# Patient Record
Sex: Male | Born: 1975 | Race: White | Marital: Married | Smoking: Never smoker
Health system: Southern US, Community
[De-identification: ages and names within clinical notes are randomized; demographics above are authoritative.]

## PROBLEM LIST (undated history)

## (undated) DIAGNOSIS — K219 Gastro-esophageal reflux disease without esophagitis: Secondary | ICD-10-CM

## (undated) DIAGNOSIS — J309 Allergic rhinitis, unspecified: Secondary | ICD-10-CM

## (undated) HISTORY — DX: Gastro-esophageal reflux disease without esophagitis: K21.9

## (undated) HISTORY — PX: ADENOIDECTOMY AND MYRINGOTOMY WITH TUBE PLACEMENT: SHX5714

## (undated) HISTORY — DX: Allergic rhinitis, unspecified: J30.9

---

## 2019-02-14 ENCOUNTER — Other Ambulatory Visit: Payer: Self-pay

## 2019-02-14 ENCOUNTER — Encounter: Payer: Self-pay | Admitting: Family Medicine

## 2019-02-14 ENCOUNTER — Ambulatory Visit: Payer: Self-pay | Attending: Family Medicine | Admitting: Family Medicine

## 2019-02-14 VITALS — BP 121/72 | HR 93 | Temp 98.6°F | Resp 18 | Ht 74.0 in | Wt 191.0 lb

## 2019-02-14 DIAGNOSIS — E876 Hypokalemia: Secondary | ICD-10-CM

## 2019-02-14 DIAGNOSIS — K760 Fatty (change of) liver, not elsewhere classified: Secondary | ICD-10-CM

## 2019-02-14 DIAGNOSIS — R1013 Epigastric pain: Secondary | ICD-10-CM

## 2019-02-14 MED ORDER — OMEPRAZOLE 40 MG PO CPDR: 40.0000 mg | DELAYED_RELEASE_CAPSULE | Freq: Two times a day (BID) | ORAL | 1 refills | Status: AC

## 2019-02-14 NOTE — Patient Instructions (Signed)
Fatty Liver Disease  Fatty liver disease occurs when too much fat has built up in your liver cells. Fatty liver disease is also called hepatic steatosis or steatohepatitis. The liver removes harmful substances from your bloodstream and produces fluids that your body needs. It also helps your body use and store energy from the food you eat. In many cases, fatty liver disease does not cause symptoms or problems. It is often diagnosed when tests are being done for other reasons. However, over time, fatty liver can cause inflammation that may lead to more serious liver problems, such as scarring of the liver (cirrhosis) and liver failure. Fatty liver is associated with insulin resistance, increased body fat, high blood pressure (hypertension), and high cholesterol. These are features of metabolic syndrome and increase your risk for stroke, diabetes, and heart disease. What are the causes? This condition may be caused by:  Drinking too much alcohol.  Poor nutrition.  Obesity.  Cushing's syndrome.  Diabetes.  High cholesterol.  Certain drugs.  Poisons.  Some viral infections.  Pregnancy. What increases the risk? You are more likely to develop this condition if you:  Abuse alcohol.  Are overweight.  Have diabetes.  Have hepatitis.  Have a high triglyceride level.  Are pregnant. What are the signs or symptoms? Fatty liver disease often does not cause symptoms. If symptoms do develop, they can include:  Fatigue.  Weakness.  Weight loss.  Confusion.  Abdominal pain.  Nausea and vomiting.  Yellowing of your skin and the white parts of your eyes (jaundice).  Itchy skin. How is this diagnosed? This condition may be diagnosed by:  A physical exam and medical history.  Blood tests.  Imaging tests, such as an ultrasound, CT scan, or MRI.  A liver biopsy. A small sample of liver tissue is removed using a needle. The sample is then looked at under a microscope. How  is this treated? Fatty liver disease is often caused by other health conditions. Treatment for fatty liver may involve medicines and lifestyle changes to manage conditions such as:  Alcoholism.  High cholesterol.  Diabetes.  Being overweight or obese. Follow these instructions at home:   Do not drink alcohol. If you have trouble quitting, ask your health care provider how to safely quit with the help of medicine or a supervised program. This is important to keep your condition from getting worse.  Eat a healthy diet as told by your health care provider. Ask your health care provider about working with a diet and nutrition specialist (dietitian) to develop an eating plan.  Exercise regularly. This can help you lose weight and control your cholesterol and diabetes. Talk to your health care provider about an exercise plan and which activities are best for you.  Take over-the-counter and prescription medicines only as told by your health care provider.  Keep all follow-up visits as told by your health care provider. This is important. Contact a health care provider if: You have trouble controlling your:  Blood sugar. This is especially important if you have diabetes.  Cholesterol.  Drinking of alcohol. Get help right away if:  You have abdominal pain.  You have jaundice.  You have nausea and vomiting.  You vomit blood or material that looks like coffee grounds.  You have stools that are black, tar-like, or bloody. Summary  Fatty liver disease develops when too much fat builds up in the cells of your liver.  Fatty liver disease often causes no symptoms or problems. However, over   time, fatty liver can cause inflammation that may lead to more serious liver problems, such as scarring of the liver (cirrhosis).  You are more likely to develop this condition if you abuse alcohol, are pregnant, are overweight, have diabetes, have hepatitis, or have high triglyceride levels.   Contact your health care provider if you have trouble controlling your weight, blood sugar, cholesterol, or drinking of alcohol. This information is not intended to replace advice given to you by your health care provider. Make sure you discuss any questions you have with your health care provider. Document Released: 01/02/2006 Document Revised: 08/26/2017 Document Reviewed: 08/26/2017 Elsevier Interactive Patient Education  2019 [employer].  Food Choices for Gastroesophageal Reflux Disease, Adult When you have gastroesophageal reflux disease (GERD), the foods you eat and your eating habits are very important. Choosing the right foods can help ease your discomfort. Think about working with a nutrition specialist (dietitian) to help you make good choices. What are tips for following this plan?  Meals  Choose healthy foods that are low in fat, such as fruits, vegetables, whole grains, low-fat dairy products, and lean meat, fish, and poultry.  Eat small meals often instead of 3 large meals a day. Eat your meals slowly, and in a place where you are relaxed. Avoid bending over or lying down until 2-3 hours after eating.  Avoid eating meals 2-3 hours before bed.  Avoid drinking a lot of liquid with meals.  Cook foods using methods other than frying. Bake, grill, or broil food instead.  Avoid or limit: ? Chocolate. ? Peppermint or spearmint. ? Alcohol. ? Pepper. ? Black and decaffeinated coffee. ? Black and decaffeinated tea. ? Bubbly (carbonated) soft drinks. ? Caffeinated energy drinks and soft drinks.  Limit high-fat foods such as: ? Fatty meat or fried foods. ? Whole milk, cream, butter, or ice cream. ? Nuts and nut butters. ? Pastries, donuts, and sweets made with butter or shortening.  Avoid foods that cause symptoms. These foods may be different for everyone. Common foods that cause symptoms include: ? Tomatoes. ? Oranges, lemons, and limes. ? Peppers. ? Spicy food. ?  Onions and garlic. ? Vinegar. Lifestyle  Maintain a healthy weight. Ask your doctor what weight is healthy for you. If you need to lose weight, work with your doctor to do so safely.  Exercise for at least 30 minutes for 5 or more days each week, or as told by your doctor.  Wear loose-fitting clothes.  Do not smoke. If you need help quitting, ask your doctor.  Sleep with the head of your bed higher than your feet. Use a wedge under the mattress or blocks under the bed frame to raise the head of the bed. Summary  When you have gastroesophageal reflux disease (GERD), food and lifestyle choices are very important in easing your symptoms.  Eat small meals often instead of 3 large meals a day. Eat your meals slowly, and in a place where you are relaxed.  Limit high-fat foods such as fatty meat or fried foods.  Avoid bending over or lying down until 2-3 hours after eating.  Avoid peppermint and spearmint, caffeine, alcohol, and chocolate. This information is not intended to replace advice given to you by your health care provider. Make sure you discuss any questions you have with your health care provider. Document Released: 05/18/2012 Document Revised: 12/23/2016 Document Reviewed: 12/23/2016 Elsevier Interactive Patient Education  2019 [employer].

## 2019-02-14 NOTE — Progress Notes (Signed)
New Patient Office Visit  Subjective:  Patient ID: Henry Simasicholas Robertson, male    DOB: Aug 24, 1976  Age: 43 y.o. MRN: [id number]  CC: New patient with complaint of mid abdominal pain, recurrent  HPI Henry Robertson presents to establish care.  Patient reports that he has had recurrent episodes since last summer of mid upper abdominal discomfort.  Patient states that last year he was seen in the emergency department after having acute onset of pain in his mid chest and mid upper abdomen.  Patient states that he was diagnosed with irritation of his esophagus and he was given medication to reduce stomach acid which he did not take.  Patient states that in the past he took PPIs for 10 to 12 years and then had difficulty weaning himself off of the medication.  Patient states that he had heard bad things regarding the medication and he did not wish to take the medication.  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  Patient does have burping/belching and backwash of fluid into the throat and mouth at times.  Patient also states that he had an ultrasound done of his liver and gallbladder as well as a HIDA scan and he was told that his gallbladder was normal.  Patient is aware that he also was told that he has a fatty liver.  He has occasional right upper quadrant discomfort but most of his discomfort is in his mid upper abdomen.  Patient denies any diarrhea or constipation.  Patient with some occasional nausea.  Patient denies any frequent alcohol use and in fact states that his alcohol use is rare.  Patient does not smoke.  He denies any significant past medical history other than prior reflux for which he took PPIs which he thinks may have been omeprazole.  He also has to take medication for allergic rhinitis seasonally.  Patient with past history of insertion of ear tubes and removal of his adenoids which occurred when  he was somewhere between 89 to 43 years old.  Patient reports no significant family history and family history is negative for cancers, heart disease, hypertension, stroke and diabetes. Past Medical History:  Diagnosis Date  . Allergic rhinitis   . GERD (gastroesophageal reflux disease)    patient reports using PPI therapy for 12 years then quit due to concerns about the medication   Past Surgical History:  Procedure Laterality Date  . ADENOIDECTOMY AND MYRINGOTOMY WITH TUBE PLACEMENT     done at age 43 or 14-patient unsure   Social History   Tobacco Use  . Smoking status: Never Smoker  . Smokeless tobacco: Never Used  Substance Use Topics  . Alcohol use: Not Currently  . Drug use: Not Currently   Family History  Problem Relation Age of Onset  . Cancer Neg Hx   . CAD Neg Hx   . Diabetes Neg Hx   . Hypertension Neg Hx    Allergies  Allergen Reactions  . Dairy Aid [Lactase] Anaphylaxis  Patient reports throat swelling with consumption of dairy products such as ice cream    ROS Review of Systems  Constitutional: Negative for chills, fatigue and fever.  HENT: Positive for congestion. Negative for ear pain, hearing loss, nosebleeds, postnasal drip and rhinorrhea.   Eyes: Negative for photophobia and visual disturbance.  Respiratory: Negative for cough and shortness of breath.   Cardiovascular: Positive for chest  pain (occasional substernal chest pressure). Negative for palpitations and leg swelling.  Gastrointestinal: Positive for abdominal pain and nausea. Negative for blood in stool, constipation and diarrhea.  Endocrine: Negative for cold intolerance, heat intolerance, polydipsia, polyphagia and polyuria.  Genitourinary: Negative for dysuria and frequency.  Musculoskeletal: Negative for arthralgias, back pain, gait problem and joint swelling.  Neurological: Negative for dizziness and headaches.  Hematological: Negative for adenopathy. Does not bruise/bleed easily.     Objective:   Today's Vitals: BP 121/72 (BP Location: Left Arm, Patient Position: Sitting, Cuff Size: Normal)   Pulse 93   Temp 98.6 F (37 C) (Oral)   Resp 18   Ht 6\' 2"  (1.88 m)   Wt 191 lb (86.6 kg)   SpO2 100%   BMI 24.52 kg/m   Physical Exam Vitals signs and nursing note reviewed.  Constitutional:      General: He is not in acute distress.    Appearance: Normal appearance. He is not ill-appearing.     Comments: Patient is accompanied by his girlfriend at today's visit  HENT:     Head: Normocephalic and atraumatic.     Right Ear: Ear canal and external ear normal.     Left Ear: Ear canal and external ear normal.     Ears:     Comments: Right TM is light pink and left TM is dull and slightly retracted    Nose: Congestion and rhinorrhea (mild clear nasal drainage) present.     Mouth/Throat:     Mouth: Mucous membranes are moist.     Pharynx: Posterior oropharyngeal erythema (mild to moderate posterior pharnyx erythema) present.  Eyes:     General: No scleral icterus.    Extraocular Movements: Extraocular movements intact.     Conjunctiva/sclera: Conjunctivae normal.  Cardiovascular:     Rate and Rhythm: Normal rate and regular rhythm.  Pulmonary:     Effort: Pulmonary effort is normal.     Breath sounds: Normal breath sounds.  Abdominal:     General: Bowel sounds are normal.     Palpations: Abdomen is soft.     Tenderness: There is no abdominal tenderness. There is no right CVA tenderness, left CVA tenderness or guarding.  Musculoskeletal:        General: No tenderness.     Right lower leg: No edema.     Left lower leg: No edema.  Skin:    General: Skin is warm and dry.     Findings: No lesion or rash.  Neurological:     General: No focal deficit present.     Mental Status: He is alert and oriented to person, place, and time.  Psychiatric:        Mood and Affect: Mood normal.        Behavior: Behavior normal.        Thought Content: Thought content  normal.        Judgment: Judgment normal.     Comments: Slightly anxious     Assessment & Plan:  1. Epigastric pain Patient with complaint of epigastric pain.  Patient is reluctant to take PPI therapy secondary to hearing bad things about PPIs in the past and patient reports past use of PPIs for 10 to 12 years.  Patient will have H. pylori breath test done at today's visit to look for bacteria that may cause stomach ulcers.  Patient was prescribed Prilosec 40 mg twice daily in case of positive H. pylori breath test and also to see if  this would help improve his symptoms however patient states that he likely will not start the medication until he knows about the results of the H. pylori test.  Patient also has been referred to gastroenterology for further evaluation and treatment.  Patient has had prior ED visit for upper abdominal pain/atypical chest pain after alcohol consumption and patient with negative lipase at that time.  Patient will have CMP to look for any electrolyte or liver abnormality that may be contributing to his symptoms.  Patient has had prior right upper quadrant ultrasound with normal gallbladder and presence of fatty liver.  Patient also had hepatobiliary scan on 05/31/2018 with normal ejection fraction of 54%. - H. pylori breath test - omeprazole (PRILOSEC) 40 MG capsule; Take 1 capsule (40 mg total) by mouth 2 (two) times daily. To reduce stomach acid  Dispense: 60 capsule; Refill: 1 - Comprehensive metabolic panel - Ambulatory referral to Gastroenterology  2. Fatty liver Patient with prior ultrasound of the liver 05/26/2018 at [facility] [place] in [place] showing mild hepatomegaly with diffuse fatty infiltration of the liver.  Patient is advised to avoid alcohol, Tylenol as well as to decrease consumption of fatty foods.  Patient has been referred to GI for further evaluation.  Patient will have CMP in follow-up - Comprehensive metabolic panel  3.  Hypokalemia Patient has had prior hypokalemia with potassium level of 3.0 during emergency department visit on 05/20/2018 and [place] and patient will have repeat of electrolytes/potassium as part of his CMP - Comprehensive metabolic panel  Allergies as of 02/14/2019      Reactions   Dairy Aid [lactase] Anaphylaxis      Medication List       Accurate as of February 14, 2019 11:59 PM. Always use your most recent med list.        omeprazole 40 MG capsule Commonly known as:  PRILOSEC Take 1 capsule (40 mg total) by mouth 2 (two) times daily. To reduce stomach acid   ONE-A-DAY MENS PO Take 1 tablet by mouth daily.      An After Visit Summary was printed and given to the patient.   Follow-up: Return in about 6 weeks (around 03/28/2019) for epigastric pain-sooner if worsening of symptoms.  Cain Saupe, MD

## 2019-02-15 LAB — COMPREHENSIVE METABOLIC PANEL WITH GFR
ALT: 34 IU/L (ref 0–44)
AST: 32 IU/L (ref 0–40)
Albumin/Globulin Ratio: 1.8 (ref 1.2–2.2)
Albumin: 4.3 g/dL (ref 4.0–5.0)
Alkaline Phosphatase: 75 IU/L (ref 39–117)
BUN/Creatinine Ratio: 16 (ref 9–20)
BUN: 15 mg/dL (ref 6–24)
Bilirubin Total: 0.7 mg/dL (ref 0.0–1.2)
CO2: 25 mmol/L (ref 20–29)
Calcium: 8.9 mg/dL (ref 8.7–10.2)
Chloride: 102 mmol/L (ref 96–106)
Creatinine, Ser: 0.94 mg/dL (ref 0.76–1.27)
GFR calc Af Amer: 117 mL/min/1.73
GFR calc non Af Amer: 101 mL/min/1.73
Globulin, Total: 2.4 g/dL (ref 1.5–4.5)
Glucose: 90 mg/dL (ref 65–99)
Potassium: 4 mmol/L (ref 3.5–5.2)
Sodium: 143 mmol/L (ref 134–144)
Total Protein: 6.7 g/dL (ref 6.0–8.5)

## 2019-02-15 LAB — H. PYLORI BREATH TEST: H pylori Breath Test: NEGATIVE

## 2019-02-16 ENCOUNTER — Telehealth: Payer: Self-pay | Admitting: *Deleted

## 2019-02-16 NOTE — Telephone Encounter (Signed)
-----   Message from Cain Saupe, MD sent at 02/15/2019  4:58 PM EDT ----- Please notify patient of normal CMP and negative H. Pylori breath test

## 2019-02-16 NOTE — Telephone Encounter (Signed)
Medical Assistant left message on patient's home and cell voicemail. Voicemail states to give a call back to Faduma Cho with [facility] at [phone].  

## 2019-03-02 ENCOUNTER — Telehealth: Payer: Self-pay | Admitting: Family Medicine

## 2019-03-02 NOTE — Telephone Encounter (Signed)
Patient says he is willing to pay out of pocket for his GI referral and would prefer to be sent somewhere within [place]/ somewhere that offers self pay discount.

## 2019-03-02 NOTE — Telephone Encounter (Signed)
Sent Referral to [facility] Ph# [phone] #[phone] .They will contact the patient to schedule an appointment  ° °

## 2019-03-28 ENCOUNTER — Ambulatory Visit: Payer: Self-pay | Admitting: Family Medicine

## 2019-03-30 ENCOUNTER — Other Ambulatory Visit: Payer: Self-pay | Admitting: Gastroenterology

## 2019-03-30 DIAGNOSIS — R1010 Upper abdominal pain, unspecified: Secondary | ICD-10-CM

## 2019-03-30 DIAGNOSIS — K219 Gastro-esophageal reflux disease without esophagitis: Secondary | ICD-10-CM

## 2019-04-08 ENCOUNTER — Ambulatory Visit
Admission: RE | Admit: 2019-04-08 | Discharge: 2019-04-08 | Disposition: A | Discharge status: Home / Self Care | Payer: Self-pay | Source: Ambulatory Visit | Attending: Gastroenterology | Admitting: Gastroenterology

## 2019-04-08 DIAGNOSIS — K219 Gastro-esophageal reflux disease without esophagitis: Secondary | ICD-10-CM

## 2019-04-08 DIAGNOSIS — R1010 Upper abdominal pain, unspecified: Secondary | ICD-10-CM

## 2019-04-11 ENCOUNTER — Other Ambulatory Visit: Payer: Self-pay | Admitting: Gastroenterology

## 2019-04-11 DIAGNOSIS — R1010 Upper abdominal pain, unspecified: Secondary | ICD-10-CM

## 2019-04-18 ENCOUNTER — Ambulatory Visit
Admission: RE | Admit: 2019-04-18 | Discharge: 2019-04-18 | Disposition: A | Discharge status: Home / Self Care | Payer: No Typology Code available for payment source | Source: Ambulatory Visit | Attending: Gastroenterology | Admitting: Gastroenterology

## 2019-04-18 ENCOUNTER — Other Ambulatory Visit: Payer: No Typology Code available for payment source

## 2019-04-18 ENCOUNTER — Other Ambulatory Visit: Payer: Self-pay | Admitting: Gastroenterology

## 2019-04-18 DIAGNOSIS — R1010 Upper abdominal pain, unspecified: Secondary | ICD-10-CM

## 2021-01-01 IMAGING — CR ABDOMEN - 2 VIEW
2 series · 2 of 2 positions shown · non-contrast
Comparison: Upper GI series 04/08/2019

CLINICAL DATA: Epigastric pain

EXAM:
ABDOMEN - 2 VIEW

[t abdomen supine]
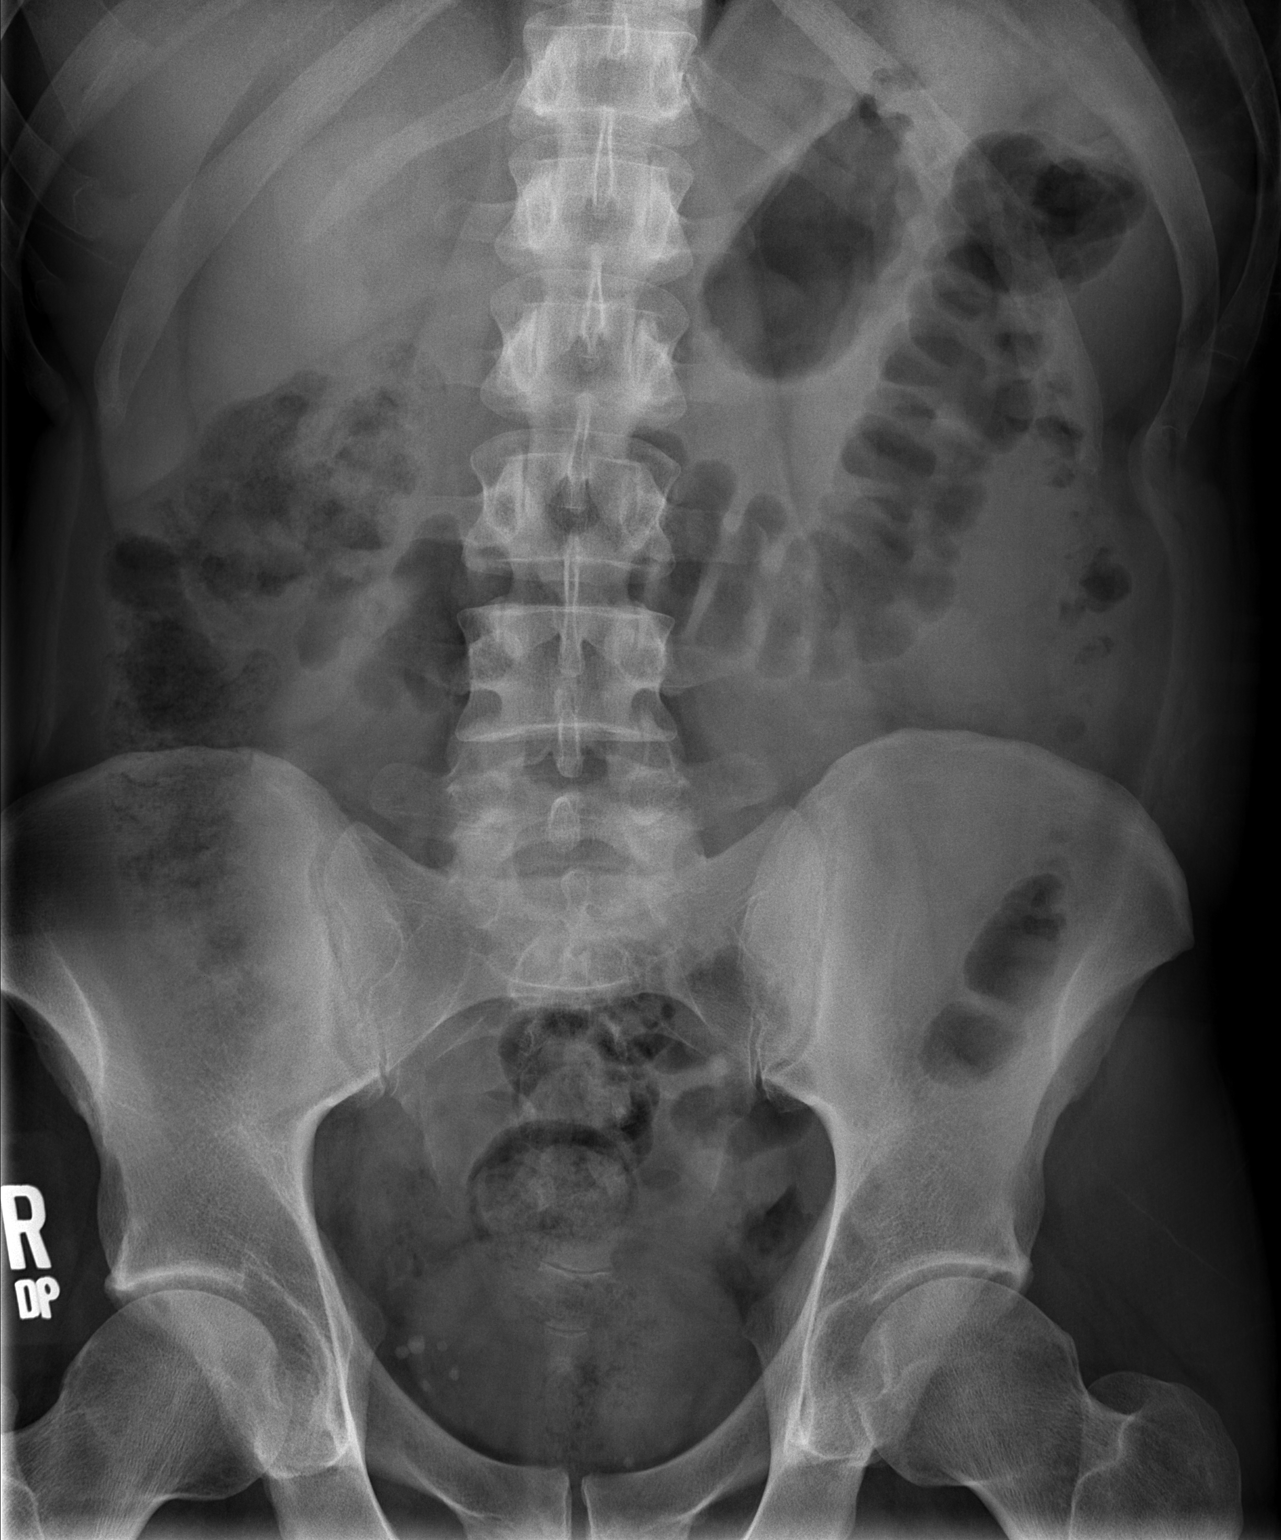

[w abdomen upright]
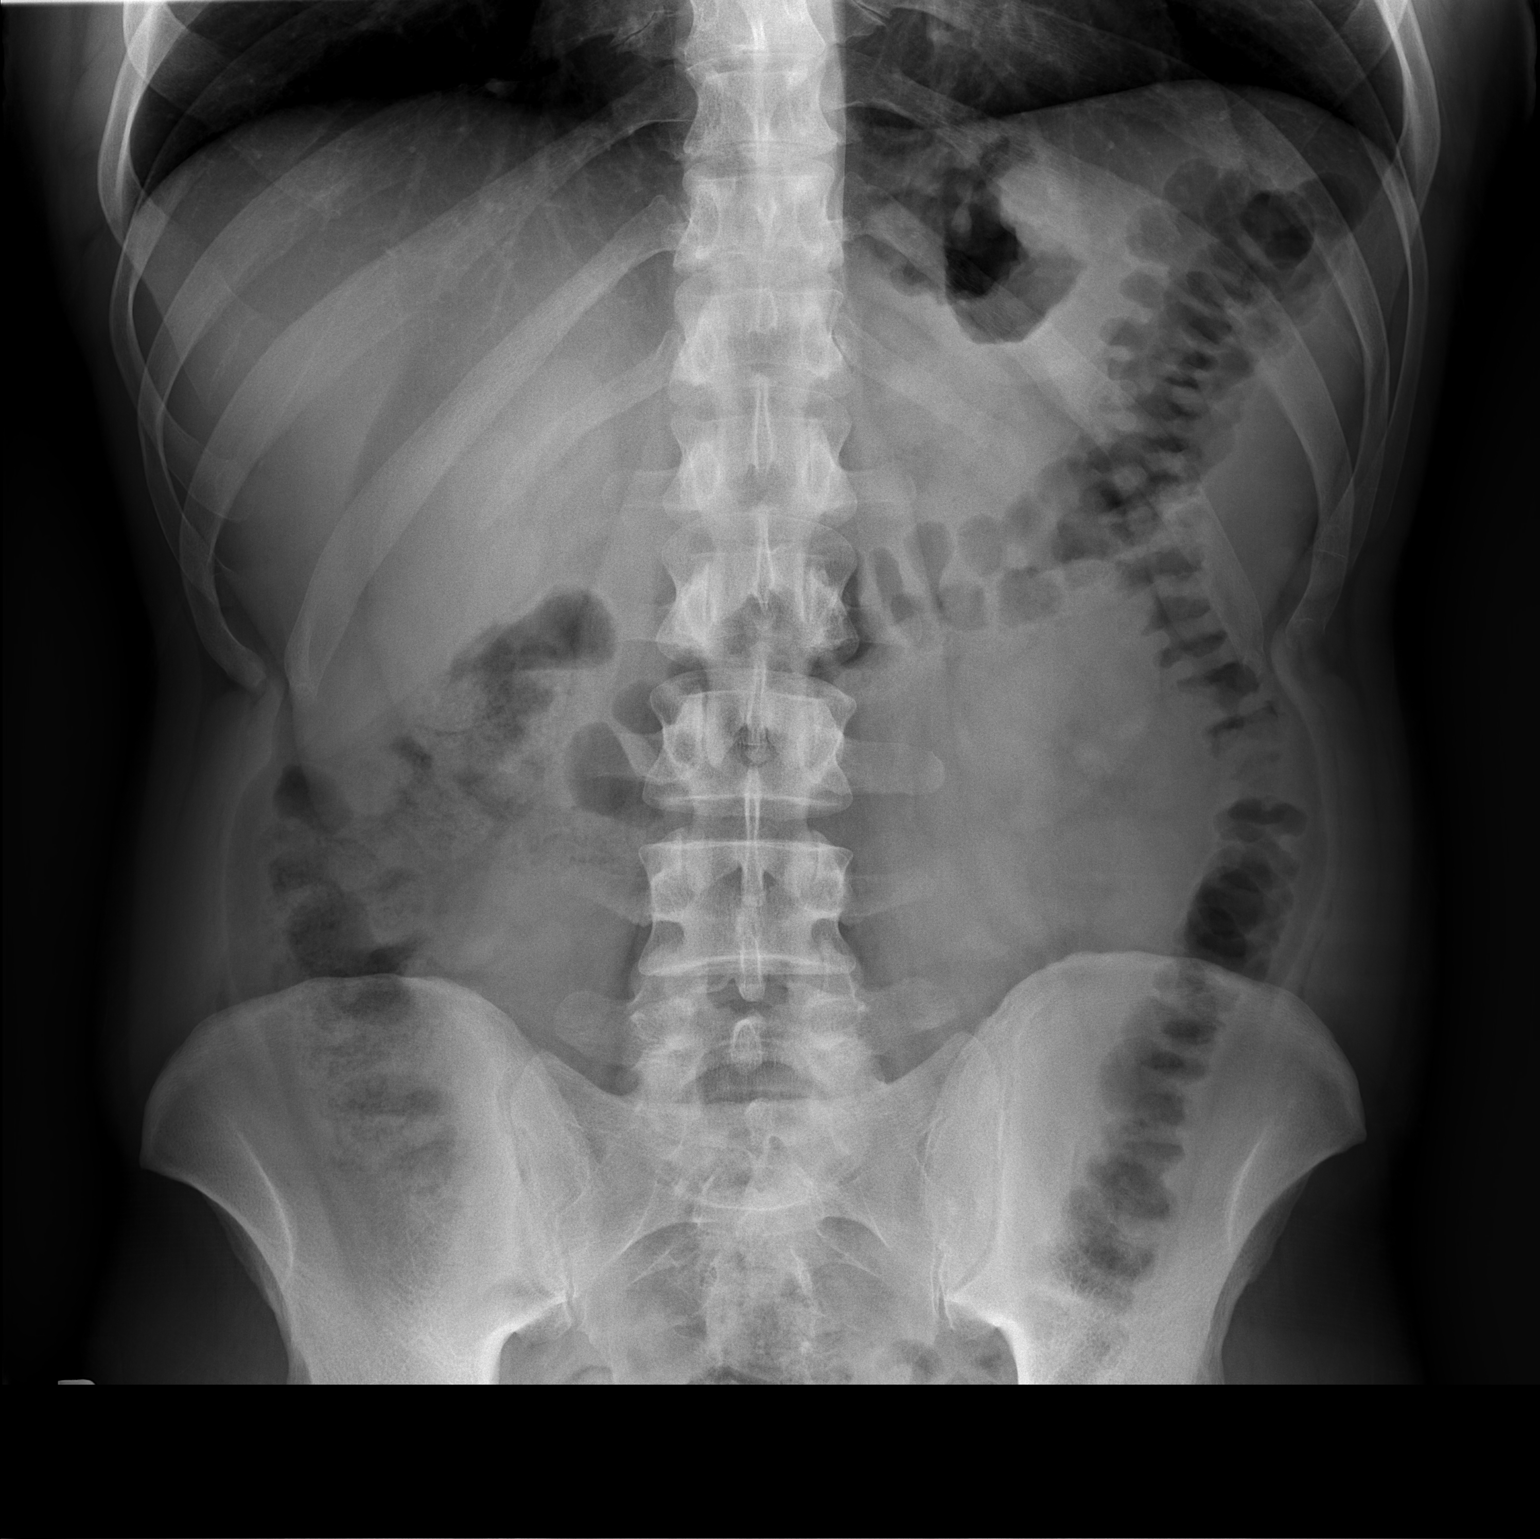

[2 of 2 positions shown; findings below may reference images not displayed]

FINDINGS: The bowel gas pattern is normal. There is no evidence of free air.
No radio-opaque calculi or other significant radiographic
abnormality is seen.
IMPRESSION: Negative.
